# Patient Record
Sex: Male | Born: 1980 | Race: White | Hispanic: No | Marital: Single | Smoking: Former smoker
Health system: Southern US, Community
[De-identification: ages and names within clinical notes are randomized; demographics above are authoritative.]

## PROBLEM LIST (undated history)

## (undated) DIAGNOSIS — K921 Melena: Secondary | ICD-10-CM

## (undated) DIAGNOSIS — K219 Gastro-esophageal reflux disease without esophagitis: Secondary | ICD-10-CM

## (undated) DIAGNOSIS — F1910 Other psychoactive substance abuse, uncomplicated: Secondary | ICD-10-CM

## (undated) DIAGNOSIS — F329 Major depressive disorder, single episode, unspecified: Secondary | ICD-10-CM

## (undated) DIAGNOSIS — R51 Headache: Secondary | ICD-10-CM

## (undated) DIAGNOSIS — M791 Myalgia, unspecified site: Secondary | ICD-10-CM

## (undated) DIAGNOSIS — E785 Hyperlipidemia, unspecified: Secondary | ICD-10-CM

## (undated) DIAGNOSIS — I10 Essential (primary) hypertension: Secondary | ICD-10-CM

## (undated) DIAGNOSIS — F32A Depression, unspecified: Secondary | ICD-10-CM

## (undated) HISTORY — DX: Other psychoactive substance abuse, uncomplicated: F19.10

## (undated) HISTORY — DX: Major depressive disorder, single episode, unspecified: F32.9

## (undated) HISTORY — DX: Melena: K92.1

## (undated) HISTORY — DX: Gastro-esophageal reflux disease without esophagitis: K21.9

## (undated) HISTORY — DX: Depression, unspecified: F32.A

## (undated) HISTORY — DX: Headache: R51

## (undated) HISTORY — DX: Essential (primary) hypertension: I10

## (undated) HISTORY — DX: Myalgia, unspecified site: M79.10

## (undated) HISTORY — DX: Hyperlipidemia, unspecified: E78.5

---

## 2006-12-28 ENCOUNTER — Emergency Department (HOSPITAL_COMMUNITY): Admission: EM | Admit: 2006-12-28 | Discharge: 2006-12-28 | Payer: Self-pay | Admitting: Emergency Medicine

## 2012-04-12 ENCOUNTER — Encounter: Payer: Self-pay | Admitting: Family Medicine

## 2012-04-12 ENCOUNTER — Ambulatory Visit (INDEPENDENT_AMBULATORY_CARE_PROVIDER_SITE_OTHER): Payer: No Typology Code available for payment source | Admitting: Family Medicine

## 2012-04-12 VITALS — BP 120/80 | HR 60 | Temp 98.8°F | Resp 12 | Ht 76.0 in | Wt 308.0 lb

## 2012-04-12 DIAGNOSIS — I10 Essential (primary) hypertension: Secondary | ICD-10-CM

## 2012-04-12 DIAGNOSIS — Z8659 Personal history of other mental and behavioral disorders: Secondary | ICD-10-CM

## 2012-04-12 DIAGNOSIS — K219 Gastro-esophageal reflux disease without esophagitis: Secondary | ICD-10-CM

## 2012-04-12 DIAGNOSIS — G43909 Migraine, unspecified, not intractable, without status migrainosus: Secondary | ICD-10-CM

## 2012-04-12 DIAGNOSIS — E785 Hyperlipidemia, unspecified: Secondary | ICD-10-CM

## 2012-04-12 DIAGNOSIS — R5383 Other fatigue: Secondary | ICD-10-CM

## 2012-04-12 MED ORDER — PROPRANOLOL HCL ER 60 MG PO CP24: 60.0000 mg | ORAL_CAPSULE | Freq: Every day | ORAL | Status: DC

## 2012-04-12 MED ORDER — SUMATRIPTAN SUCCINATE 100 MG PO TABS: 100.0000 mg | ORAL_TABLET | ORAL | Status: AC | PRN

## 2012-04-12 MED ORDER — LORAZEPAM 0.5 MG PO TABS: ORAL_TABLET | ORAL | Status: DC

## 2012-04-12 NOTE — Patient Instructions (Signed)
Relpax may take one at onset of migraine and repeat one in 2 hours as needed (max of 2/24hours)

## 2012-04-12 NOTE — Progress Notes (Signed)
  Subjective:    Patient ID: Kyle Leon, male    DOB: [birth year], 31 y.o.   MRN: [id number]  HPI  New patient. Past medical history reviewed. Chronic problems include history of GERD, migraine headaches, hypertension, hyperlipidemia, and past history of depression which is currently stable. Has been on propranolol 60 mg per for prevention of migraines and still has about one or 2 per month. Frequently followed by stress and possibly dehydration. GERD which has been well controlled with omeprazole. He has recently tapered himself off and tolerating well. Reported mild hypertension controlled with propranolol. History of hyperlipidemia.  Patient has had some recent fatigue issues. Works 2 jobs and stays quite busy and frequently only a few hours sleep a night. Frequent insomnia issues. Has recently started doing more consistent exercise.  No prior surgeries. Family history significant for hyperlipidemia and hypertension in parents. Patient is single. 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 Nonsmoker. Occasional marijuana use. Rare alcohol use.   Review of Systems  Constitutional: Positive for fatigue. Negative for fever, chills, appetite change and unexpected weight change.  Eyes: Negative for visual disturbance.  Respiratory: Negative for cough and shortness of breath.   Cardiovascular: Negative for chest pain, palpitations and leg swelling.  Gastrointestinal: Negative for abdominal pain.  Genitourinary: Negative for dysuria.  Skin: Negative for rash.  Neurological: Positive for headaches. Negative for dizziness, syncope and weakness.  Hematological: Negative for adenopathy. Does not bruise/bleed easily.  Psychiatric/Behavioral: Negative for dysphoric mood.       Objective:   Physical Exam  Constitutional: He is oriented to person, place, and time. He appears well-developed and well-nourished. No distress.  HENT:  Right Ear: External  ear normal.  Left Ear: External ear normal.  Mouth/Throat: Oropharynx is clear and moist.  Neck: Neck supple. No thyromegaly present.  Cardiovascular: Normal rate and regular rhythm.   Pulmonary/Chest: Effort normal and breath sounds normal. No respiratory distress. He has no wheezes. He has no rales.  Musculoskeletal: He exhibits no edema.  Lymphadenopathy:    He has no cervical adenopathy.  Neurological: He is alert and oriented to person, place, and time. No cranial nerve deficit.  Psychiatric: He has a normal mood and affect. His behavior is normal.          Assessment & Plan:  #1 migraine headache. Patient suffering migraine today in office. Sample of Relpax given 40 mg patient did see good relief after about 30 minutes. Samples given with instruction and also prescription for Imitrex to try as suspect he'll have less co-pay with generic Imitrex. #2 hypertension. Refill propranolol. Blood pressure appears well controlled #3 history of GERD. Currently stable off medication #4 situational anxiety. Patient requesting something for upcoming flight. Lorazepam 0.5 mg one to 2 one hour prior to flight  #5 Gen. fatigue. Probably related to lack of sleep. Obtain baseline labs #6 hyperlipidemia. Check lipid and hepatic panel

## 2012-04-13 LAB — CBC WITH DIFFERENTIAL/PLATELET
Basophils Absolute: 0 10*3/uL (ref 0.0–0.1)
Basophils Relative: 0.5 % (ref 0.0–3.0)
Eosinophils Absolute: 0.1 10*3/uL (ref 0.0–0.7)
HCT: 47.1 % (ref 39.0–52.0)
Hemoglobin: 16.1 g/dL (ref 13.0–17.0)
Lymphs Abs: 2.3 10*3/uL (ref 0.7–4.0)
MCHC: 34.1 g/dL (ref 30.0–36.0)
Neutro Abs: 5.3 10*3/uL (ref 1.4–7.7)
RBC: 5.24 Mil/uL (ref 4.22–5.81)
RDW: 13.1 % (ref 11.5–14.6)

## 2012-04-13 LAB — BASIC METABOLIC PANEL
BUN: 12 mg/dL (ref 6–23)
CO2: 25 mEq/L (ref 19–32)
Chloride: 104 mEq/L (ref 96–112)
Creatinine, Ser: 0.9 mg/dL (ref 0.4–1.5)
Glucose, Bld: 74 mg/dL (ref 70–99)

## 2012-04-13 LAB — HEPATIC FUNCTION PANEL
Alkaline Phosphatase: 72 U/L (ref 39–117)
Bilirubin, Direct: 0.1 mg/dL (ref 0.0–0.3)
Total Bilirubin: 0.9 mg/dL (ref 0.3–1.2)

## 2012-04-13 LAB — LIPID PANEL
LDL Cholesterol: 76 mg/dL (ref 0–99)
Total CHOL/HDL Ratio: 3

## 2012-04-14 NOTE — Progress Notes (Signed)
Quick Note:  pT INFORMED ______

## 2012-04-17 ENCOUNTER — Other Ambulatory Visit: Payer: Self-pay | Admitting: Family Medicine

## 2012-09-05 ENCOUNTER — Other Ambulatory Visit: Payer: Self-pay | Admitting: *Deleted

## 2012-09-05 MED ORDER — PROPRANOLOL HCL ER 60 MG PO CP24: 60.0000 mg | ORAL_CAPSULE | Freq: Every day | ORAL | Status: DC

## 2012-09-08 ENCOUNTER — Other Ambulatory Visit: Payer: Self-pay | Admitting: *Deleted

## 2012-09-08 MED ORDER — PROPRANOLOL HCL ER 60 MG PO CP24: 60.0000 mg | ORAL_CAPSULE | Freq: Every day | ORAL | Status: DC

## 2013-08-20 ENCOUNTER — Ambulatory Visit (INDEPENDENT_AMBULATORY_CARE_PROVIDER_SITE_OTHER): Payer: No Typology Code available for payment source | Admitting: Family Medicine

## 2013-08-20 ENCOUNTER — Encounter: Payer: Self-pay | Admitting: Family Medicine

## 2013-08-20 ENCOUNTER — Ambulatory Visit (INDEPENDENT_AMBULATORY_CARE_PROVIDER_SITE_OTHER)
Admission: RE | Admit: 2013-08-20 | Discharge: 2013-08-20 | Disposition: A | Discharge status: Home / Self Care | Payer: No Typology Code available for payment source | Source: Ambulatory Visit | Attending: Family Medicine | Admitting: Family Medicine

## 2013-08-20 VITALS — BP 112/82 | HR 83 | Temp 98.3°F | Wt 323.0 lb

## 2013-08-20 DIAGNOSIS — Z7189 Other specified counseling: Secondary | ICD-10-CM

## 2013-08-20 DIAGNOSIS — M79609 Pain in unspecified limb: Secondary | ICD-10-CM

## 2013-08-20 DIAGNOSIS — Z716 Tobacco abuse counseling: Secondary | ICD-10-CM

## 2013-08-20 DIAGNOSIS — S62309A Unspecified fracture of unspecified metacarpal bone, initial encounter for closed fracture: Secondary | ICD-10-CM

## 2013-08-20 DIAGNOSIS — M79641 Pain in right hand: Secondary | ICD-10-CM

## 2013-08-20 MED ORDER — VARENICLINE TARTRATE 0.5 MG X 11 & 1 MG X 42 PO MISC: ORAL | Status: AC

## 2013-08-20 MED ORDER — VARENICLINE TARTRATE 1 MG PO TABS: 1.0000 mg | ORAL_TABLET | Freq: Two times a day (BID) | ORAL | Status: DC

## 2013-08-20 MED ORDER — PROPRANOLOL HCL ER 60 MG PO CP24: 60.0000 mg | ORAL_CAPSULE | Freq: Every day | ORAL | Status: DC
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
## 2013-08-20 NOTE — Progress Notes (Signed)
Pre visit review using our clinic review tool, if applicable. No additional management support is needed unless otherwise documented below in the visit note. 

## 2013-09-28 ENCOUNTER — Other Ambulatory Visit (INDEPENDENT_AMBULATORY_CARE_PROVIDER_SITE_OTHER): Payer: PRIVATE HEALTH INSURANCE

## 2013-09-28 DIAGNOSIS — Z0000 Encounter for general adult medical examination without abnormal findings: Secondary | ICD-10-CM

## 2013-09-28 LAB — BASIC METABOLIC PANEL
BUN: 11 mg/dL (ref 6–23)
CALCIUM: 9.3 mg/dL (ref 8.4–10.5)
CO2: 26 mEq/L (ref 19–32)
Chloride: 107 mEq/L (ref 96–112)
Creatinine, Ser: 1 mg/dL (ref 0.4–1.5)
GFR: 92.22 mL/min (ref >60.00)
Glucose, Bld: 92 mg/dL (ref 70–99)
Potassium: 4 mEq/L (ref 3.5–5.1)
SODIUM: 139 meq/L (ref 135–145)

## 2013-09-28 LAB — CBC WITH DIFFERENTIAL/PLATELET
BASOS ABS: 0 10*3/uL (ref 0.0–0.1)
Basophils Relative: 0.5 % (ref 0.0–3.0)
Eosinophils Absolute: 0.2 10*3/uL (ref 0.0–0.7)
Eosinophils Relative: 2.4 % (ref 0.0–5.0)
HEMATOCRIT: 44.3 % (ref 39.0–52.0)
Hemoglobin: 15.3 g/dL (ref 13.0–17.0)
LYMPHS ABS: 2.1 10*3/uL (ref 0.7–4.0)
Lymphocytes Relative: 25.8 % (ref 12.0–46.0)
MCHC: 34.6 g/dL (ref 30.0–36.0)
MCV: 87.8 fl (ref 78.0–100.0)
MONO ABS: 0.8 10*3/uL (ref 0.1–1.0)
Monocytes Relative: 9.8 % (ref 3.0–12.0)
Neutro Abs: 5 10*3/uL (ref 1.4–7.7)
Neutrophils Relative %: 61.5 % (ref 43.0–77.0)
PLATELETS: 252 10*3/uL (ref 150.0–400.0)
RBC: 5.04 Mil/uL (ref 4.22–5.81)
RDW: 13.1 % (ref 11.5–14.6)
WBC: 8.1 10*3/uL (ref 4.5–10.5)

## 2013-09-28 LAB — HEPATIC FUNCTION PANEL
ALT: 37 U/L (ref 0–53)
AST: 21 U/L (ref 0–37)
Albumin: 4 g/dL (ref 3.5–5.2)
Alkaline Phosphatase: 56 U/L (ref 39–117)
Bilirubin, Direct: 0 mg/dL (ref 0.0–0.3)
Total Bilirubin: 0.4 mg/dL (ref 0.3–1.2)
Total Protein: 7.2 g/dL (ref 6.0–8.3)

## 2013-09-28 LAB — POCT URINALYSIS DIPSTICK
Bilirubin, UA: NEGATIVE
Blood, UA: NEGATIVE
Glucose, UA: NEGATIVE
Ketones, UA: NEGATIVE
Leukocytes, UA: NEGATIVE
Nitrite, UA: NEGATIVE
Protein, UA: NEGATIVE
Spec Grav, UA: 1.015
Urobilinogen, UA: 0.2
pH, UA: 7

## 2013-09-28 LAB — LIPID PANEL
Cholesterol: 147 mg/dL (ref 0–200)
HDL: 38.3 mg/dL — ABNORMAL LOW
LDL Cholesterol: 84 mg/dL (ref 0–99)
Total CHOL/HDL Ratio: 4
Triglycerides: 123 mg/dL (ref 0.0–149.0)
VLDL: 24.6 mg/dL (ref 0.0–40.0)

## 2013-09-28 LAB — TSH: TSH: 0.88 u[IU]/mL (ref 0.35–5.50)

## 2013-10-05 ENCOUNTER — Ambulatory Visit (INDEPENDENT_AMBULATORY_CARE_PROVIDER_SITE_OTHER): Payer: PRIVATE HEALTH INSURANCE | Admitting: Family Medicine

## 2013-10-05 ENCOUNTER — Encounter: Payer: Self-pay | Admitting: Family Medicine

## 2013-10-05 VITALS — BP 126/80 | HR 66 | Temp 98.3°F | Ht 76.0 in | Wt 340.0 lb

## 2013-10-05 DIAGNOSIS — Z23 Encounter for immunization: Secondary | ICD-10-CM

## 2013-10-05 DIAGNOSIS — Z0000 Encounter for general adult medical examination without abnormal findings: Secondary | ICD-10-CM

## 2013-10-05 MED ORDER — LORAZEPAM 0.5 MG PO TABS: ORAL_TABLET | ORAL | Status: AC

## 2013-10-05 MED ORDER — AMPHETAMINE-DEXTROAMPHETAMINE 20 MG PO TABS: 20.0000 mg | ORAL_TABLET | Freq: Two times a day (BID) | ORAL | Status: DC

## 2013-10-05 NOTE — Patient Instructions (Signed)

## 2013-10-05 NOTE — Progress Notes (Signed)
Subjective:    Patient ID: Kyle Leon, male    DOB: 1981-04-06, 33 y.o.   MRN: [id number]  HPI  Patient seen for complete physical examination. He has had some significant weight gain this past year which he attributes to less physical activity. Poor compliance with diet at times. No alcohol use.  Has migraine headaches and takes propranolol for prophylaxis. Very infrequent headaches over the past year. Occasional GERD symptoms. He just stopped smoking a few days ago and is taking Chantix. Last tetanus estimated over 10 years ago. He declines flu vaccine.  Past Medical History  Diagnosis Date  . Blood in stool   . Substance abuse   . Depression   . Headache(784.0)   . GERD (gastroesophageal reflux disease)   . Hyperlipidemia   . Hypertension    No past surgical history on file.  reports that he quit smoking about 1 years ago. His smoking use included Cigarettes. He has a 10 pack-year smoking history. He does not have any smokeless tobacco history on file. His alcohol and drug histories are not on file. family history includes Alcohol abuse in his maternal grandfather, maternal grandmother, paternal grandfather, and paternal grandmother; Cancer in his maternal grandmother and maternal uncle; Diabetes in his paternal grandmother; Heart disease in his maternal grandfather and paternal grandfather; Hepatitis C in his father; Hyperlipidemia in his father; Hypertension in his father, maternal grandfather, and paternal grandfather; Stroke in his maternal grandfather and paternal grandfather. No Known Allergies   .  Review of Systems  Constitutional: Negative for fever, activity change, appetite change and fatigue.  HENT: Negative for congestion, ear pain and trouble swallowing.   Eyes: Negative for pain and visual disturbance.  Respiratory: Negative for cough, shortness of breath and wheezing.   Cardiovascular: Negative for chest pain and palpitations.  Gastrointestinal: Negative for  nausea, vomiting, abdominal pain, diarrhea, constipation, blood in stool, abdominal distention and rectal pain.  Endocrine: Negative for polydipsia and polyuria.  Genitourinary: Negative for dysuria, hematuria and testicular pain.  Musculoskeletal: Negative for arthralgias and joint swelling.  Skin: Negative for rash.  Neurological: Negative for dizziness, syncope and headaches.  Hematological: Negative for adenopathy.  Psychiatric/Behavioral: Negative for confusion and dysphoric mood.       Objective:   Physical Exam  Constitutional: He is oriented to person, place, and time. He appears well-developed and well-nourished. No distress.  HENT:  Head: Normocephalic and atraumatic.  Right Ear: External ear normal.  Left Ear: External ear normal.  Mouth/Throat: Oropharynx is clear and moist.  Eyes: Conjunctivae and EOM are normal. Pupils are equal, round, and reactive to light.  Neck: Normal range of motion. Neck supple. No thyromegaly present.  Cardiovascular: Normal rate, regular rhythm and normal heart sounds.   No murmur heard. Pulmonary/Chest: No respiratory distress. He has no wheezes. He has no rales.  Abdominal: Soft. Bowel sounds are normal. He exhibits no distension and no mass. There is no tenderness. There is no rebound and no guarding.  Musculoskeletal: He exhibits no edema.  Lymphadenopathy:    He has no cervical adenopathy.  Neurological: He is alert and oriented to person, place, and time. He displays normal reflexes. No cranial nerve deficit.  Skin: No rash noted.  Psychiatric: He has a normal mood and affect.          Assessment & Plan:  Complete physical. He has had substantial weight gain over the past year we talked at some length about importance of weight loss and strategies. Tetanus  booster given. limited Ativan to take for situational things like flying. He uses this very infrequently.

## 2013-10-05 NOTE — Progress Notes (Signed)
Pre visit review using our clinic review tool, if applicable. No additional management support is needed unless otherwise documented below in the visit note. 

## 2013-10-22 ENCOUNTER — Telehealth: Payer: Self-pay | Admitting: Family Medicine

## 2013-10-22 DIAGNOSIS — F988 Other specified behavioral and emotional disorders with onset usually occurring in childhood and adolescence: Secondary | ICD-10-CM | POA: Insufficient documentation

## 2013-10-22 MED ORDER — PROPRANOLOL HCL ER 60 MG PO CP24: 60.0000 mg | ORAL_CAPSULE | Freq: Every day | ORAL | Status: AC

## 2013-10-22 NOTE — Telephone Encounter (Signed)
RX sent to pharmacy  

## 2013-10-22 NOTE — Telephone Encounter (Signed)
Yes.  He asked for refill and had been on this in past.  I need to add addendum to note.

## 2013-10-22 NOTE — Addendum Note (Signed)
Addended by: Thomasena EdisFLOYD, Sydny Schnitzler E on: 10/22/2013 05:10 PM   Modules accepted: Orders

## 2013-10-22 NOTE — Telephone Encounter (Signed)
DX code on why the patient is on the medication.

## 2013-10-22 NOTE — Telephone Encounter (Signed)
[facility] script for propranolol ER (INDERAL LA) 60 MG 24 hr capsule

## 2013-10-22 NOTE — Telephone Encounter (Signed)
Can you verify pt's DX for the new start of Adderall?

## 2013-10-22 NOTE — Telephone Encounter (Signed)
314.00 

## 2013-12-19 ENCOUNTER — Other Ambulatory Visit: Payer: Self-pay | Admitting: Family Medicine

## 2013-12-20 ENCOUNTER — Other Ambulatory Visit: Payer: Self-pay

## 2013-12-20 ENCOUNTER — Other Ambulatory Visit: Payer: Self-pay | Admitting: Family Medicine

## 2013-12-20 MED ORDER — AMPHETAMINE-DEXTROAMPHETAMINE 20 MG PO TABS: 20.0000 mg | ORAL_TABLET | Freq: Two times a day (BID) | ORAL | Status: AC

## 2013-12-21 ENCOUNTER — Ambulatory Visit: Payer: Self-pay

## 2013-12-24 ENCOUNTER — Ambulatory Visit: Payer: No Typology Code available for payment source

## 2013-12-24 VITALS — BP 119/58 | HR 66 | Resp 18

## 2013-12-24 DIAGNOSIS — L03039 Cellulitis of unspecified toe: Secondary | ICD-10-CM

## 2013-12-24 DIAGNOSIS — L600 Ingrowing nail: Secondary | ICD-10-CM

## 2013-12-24 MED ORDER — CEPHALEXIN 500 MG PO CAPS: 500.0000 mg | ORAL_CAPSULE | Freq: Three times a day (TID) | ORAL | Status: DC

## 2013-12-24 NOTE — Progress Notes (Signed)
   Subjective:    Patient ID: Kyle Leon, male    DOB: 03-May-1981, 33 y.o.   MRN: [id number]  HPI my left big toenail has been going on mid jan of this year and I tried to trim on it and some burning and some throbbing and does drain and my job has me hiking thru woods and I have rubber boots and my feet do sweat and sore and tender    Review of Systems  All other systems reviewed and are negative.      Objective:   Physical Exam Neurovascular status is intact pedal pulses palpable DP and PT +2 over your two over four bilateral Refill timed 3-4 seconds all digits skin temperature warm turgor normal no edema rubor varicosity noted there is erythema and it isn't greatly tissue and dry blood on the medial nail fold left great toe patient had issues and lateral border as well the past orthopedic biomechanical exam unremarkable noncontributory rectus foot type otherwise noted nails otherwise criptotic hallux left with paronychia medial border and incurvation lateral border. Painful to her symptomatically patient is has for permanent nail excision and phenol matricectomy at this time local anesthetic block is administered total of 3 cc 50-50 mixture brought to 2% Xylocaine plain and 0.5% Marcaine plain. Block administered Betadine prep performed the borders of the medial lateral border of the left hallux are excised feel matricectomy followed by alcohol wash Silvadene cream and gauze dressing applied. Patient instructed in daily soaking Betadine warm water Neosporin and Band-Aid dressing daily, or Advil as needed for pain       Assessment & Plan:  Assessment paronychia with ingrowing nail left great toe medial border patient elected for nail excision of both borders left great toe due to history of recurrence of exacerbations this time box ministered the nails excised in a meniscectomy followed by alcohol wash was carried out patient will recheck in 2 weeks for nail check consider AP nail procedure  on contralateral right great toe at that time. Recommended Tylenol as need for pain at this time and a prescription for cephalexin is also for the pharmacy as at this time. Procedure went without any difficulties minimal no excessive bleeding noted followup in 2 weeks for nail check  Alvan Dameichard Casimiro Lienhard DPM

## 2013-12-24 NOTE — Patient Instructions (Signed)
Betadine Soak Instructions  Purchase an 8 oz. bottle of BETADINE solution (Povidone)  THE DAY AFTER THE PROCEDURE  Place 1 tablespoon of betadine solution in a quart of warm tap water.  Submerge your foot or feet with outer bandage intact for the initial soak; this will allow the bandage to become moist and wet for easy lift off.  Once you remove your bandage, continue to soak in the solution for 20 minutes.  This soak should be done twice a day.  Next, remove your foot or feet from solution, blot dry the affected area and cover.  You may use a band aid large enough to cover the area or use gauze and tape.  Apply other medications to the area as directed by the doctor such as cortisporin otic solution (ear drops) or neosporin.  IF YOUR SKIN BECOMES IRRITATED WHILE USING THESE INSTRUCTIONS, IT IS OKAY TO SWITCH TO EPSOM SALTS AND WATER OR WHITE VINEGAR AND WATER.  Recommended Tylenol as needed for pain or Advil , ibuprofen,r or Aleve.

## 2014-01-11 ENCOUNTER — Ambulatory Visit: Payer: No Typology Code available for payment source

## 2014-01-11 ENCOUNTER — Ambulatory Visit (INDEPENDENT_AMBULATORY_CARE_PROVIDER_SITE_OTHER): Payer: No Typology Code available for payment source

## 2014-01-11 VITALS — BP 127/74 | HR 69 | Resp 12

## 2014-01-11 DIAGNOSIS — L600 Ingrowing nail: Secondary | ICD-10-CM

## 2014-01-11 DIAGNOSIS — L03039 Cellulitis of unspecified toe: Secondary | ICD-10-CM

## 2014-01-11 MED ORDER — CEPHALEXIN 500 MG PO CAPS: 500.0000 mg | ORAL_CAPSULE | Freq: Three times a day (TID) | ORAL | Status: AC

## 2014-01-11 NOTE — Progress Notes (Signed)
   Subjective:    Patient ID: Kyle CottaScott Leon, male    DOB: [birth year], 33 y.o.   MRN: [id number]  HPI NEW PROBLEM  PT. STATED THE RT FOOT GREAT TOENAIL IT GET SORE SOMETIMES FOR 8 YEARS. THE TOE GET WORSE. THE TOENAIL GET AGGRAVATED WHEN WEARING SHOES AND TRIED NO TREATMENT.    Review of Systems no systemic changes or findings noted     Objective:   Physical Exam Lower extremity objective findings as follows neurovascular status is intact with palpable pulses epicritic and proprioceptive sensations intact and unremarkable. The left foot left great toe medial border is excised there is a slight eschar tissue present mild erythema no open wounds noted no apparent discharge or drainage noted slight tenderness along generally improved resolving following AP nail procedure left great toe this time Neosporin and Band-Aid dressing applied keep under the day where dry at night patient does point out that his right foot is also an issue injection having similar procedure performed on the right great toe at this time. There no contraindications and with a history of recurrent ingrowing nails recommendation this time is for AP nail procedure right great toe.       Assessment & Plan:  Assessment this time good postop progress following nail procedure left great toe with resultant paronychia being noted plan at this time patient physician the right hallux a permanent nail excision local anesthetic block administered total of 3 cc 50-50 mixture present Xylocaine plain and 0.5 Marcaine plain to the right great toe lateral and medial lateral borders were excised feel basically followed by alcohol wash ointment and a dry sterile dressing being applied. Patient how the procedure was given soaking instructions and Tylenol or Advil as needed for pain patient placed back and radicular cephalexin 500 mg daily x7 days for infection protocol. Followup with in 2-3 for followup for nail check if needed next  Alvan Dameichard  Tonilynn Bieker DPM

## 2014-01-11 NOTE — Patient Instructions (Signed)

## 2014-03-04 ENCOUNTER — Encounter: Payer: Self-pay | Admitting: Family Medicine

## 2014-03-05 ENCOUNTER — Other Ambulatory Visit: Payer: Self-pay

## 2014-03-05 MED ORDER — DOXYCYCLINE HYCLATE 100 MG PO CAPS: 100.0000 mg | ORAL_CAPSULE | Freq: Two times a day (BID) | ORAL | Status: AC

## 2014-11-28 ENCOUNTER — Encounter: Payer: Self-pay | Admitting: Family Medicine

## 2015-03-25 IMAGING — CR DG HAND COMPLETE 3+V*R*
3 series · 3 of 3 positions shown · non-contrast
Comparison: None

CLINICAL DATA: Pain and swelling at posterior mid right hand,
injury 4 days ago, tenderness at 3rd metacarpal

EXAM:
RIGHT HAND - COMPLETE 3+ VIEW

[view not recorded (1 of 3)]
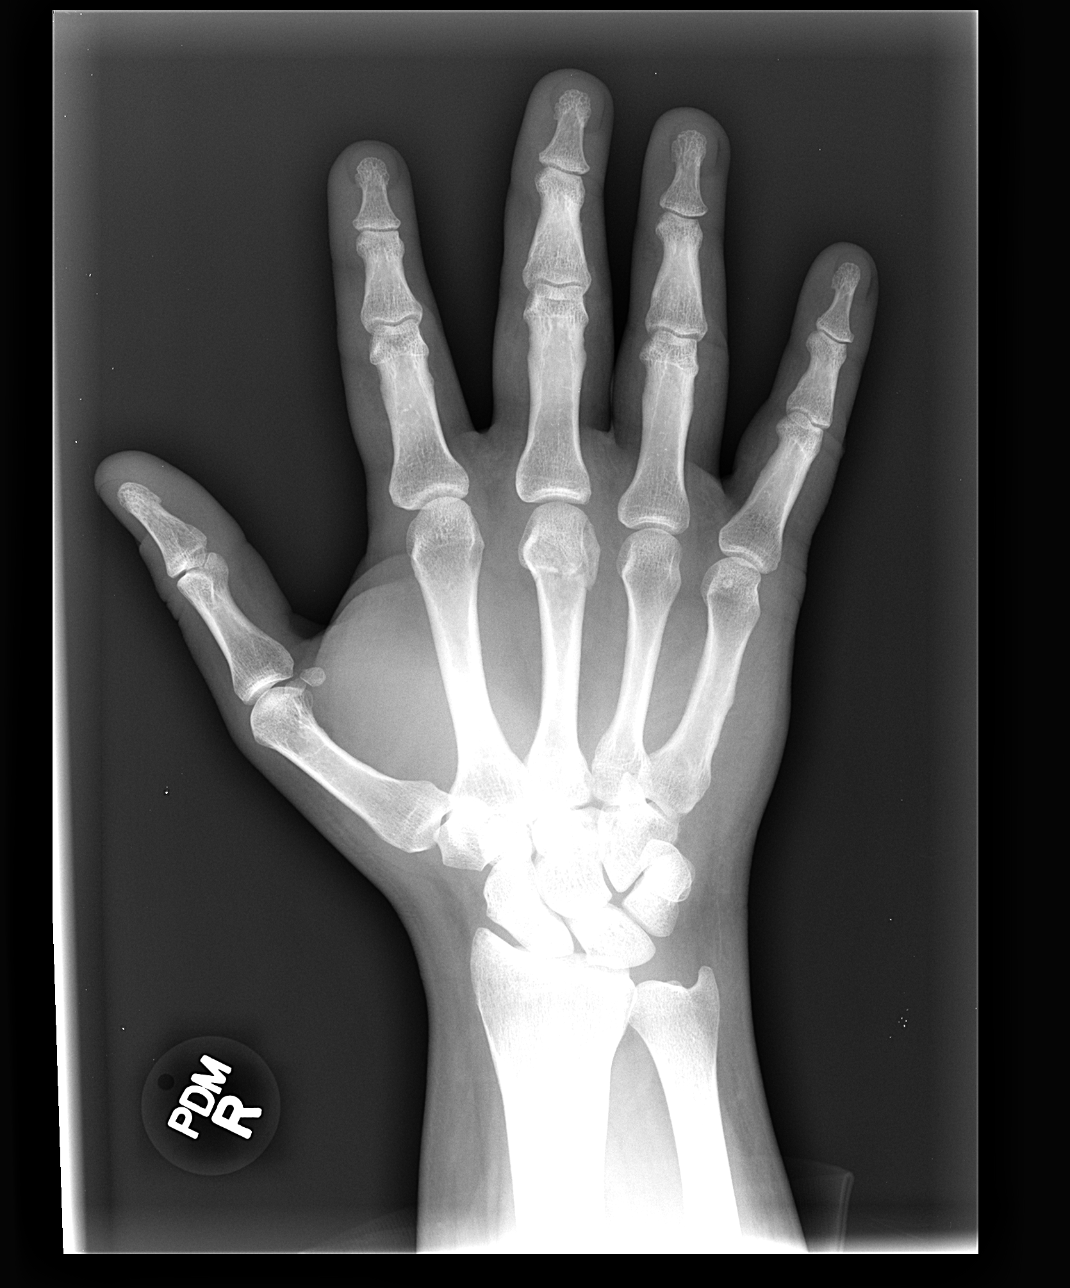

[view not recorded (2 of 3)]
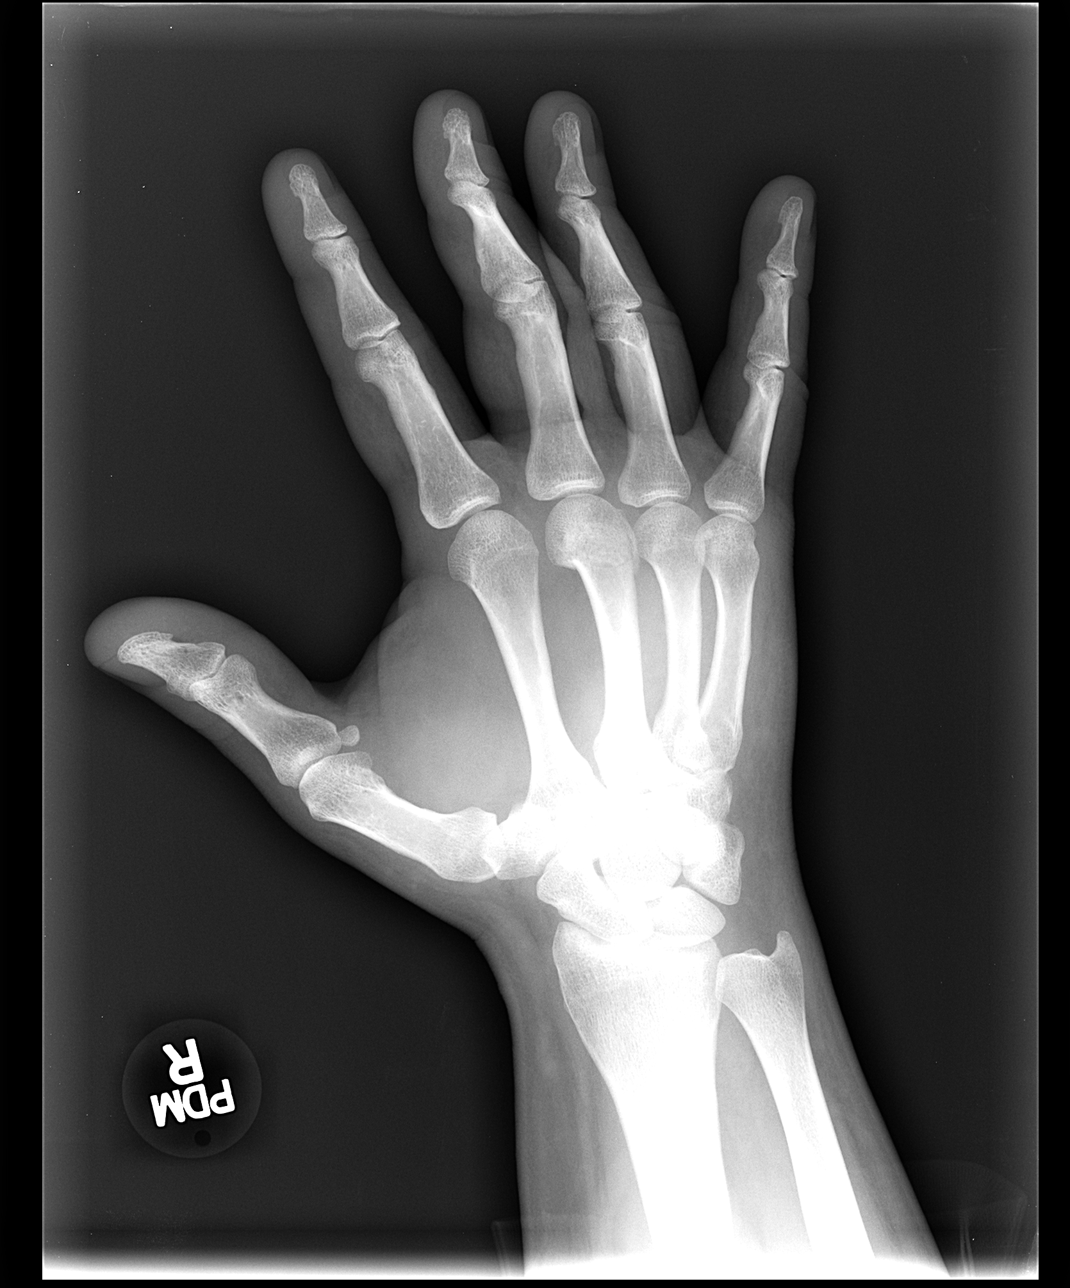

[view not recorded (3 of 3)]
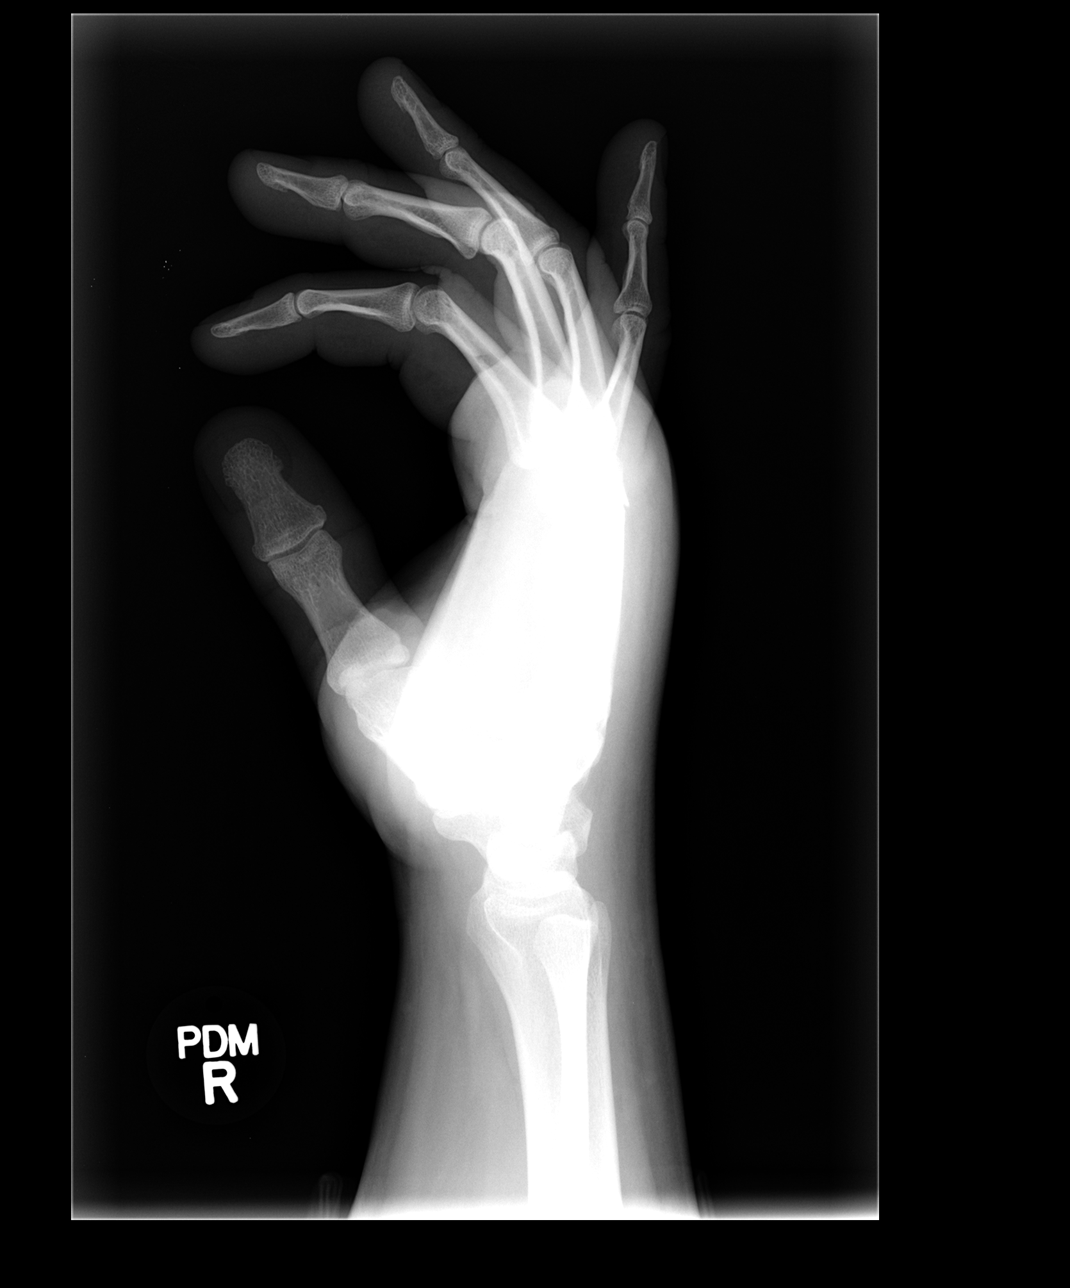

[3 of 3 positions shown; findings below may reference images not displayed]

FINDINGS: Osseous mineralization normal.

Joint spaces preserved.

Distal right 3rd metacarpal fracture minimally displaced volar with
mild apex dorsal angulation.

No additional fracture, dislocation, or bone destruction.

Significant soft tissue swelling.
IMPRESSION: Minimally displaced and angulated distal right 3rd metacarpal
fracture.

## 2017-06-02 ENCOUNTER — Encounter: Payer: Self-pay | Admitting: Family Medicine
